# Patient Record
Sex: Male | Born: 1970 | Race: White | Hispanic: No | Marital: Married | Smoking: Never smoker
Health system: Southern US, Community
[De-identification: ages and names within clinical notes are randomized; demographics above are authoritative.]

## PROBLEM LIST (undated history)

## (undated) DIAGNOSIS — E063 Autoimmune thyroiditis: Secondary | ICD-10-CM

## (undated) DIAGNOSIS — F410 Panic disorder [episodic paroxysmal anxiety] without agoraphobia: Secondary | ICD-10-CM

## (undated) HISTORY — DX: Autoimmune thyroiditis: E06.3

## (undated) HISTORY — DX: Panic disorder (episodic paroxysmal anxiety): F41.0

---

## 2015-05-12 ENCOUNTER — Emergency Department (HOSPITAL_COMMUNITY)
Admission: EM | Admit: 2015-05-12 | Discharge: 2015-05-12 | Disposition: A | Discharge status: Home / Self Care | Payer: 59 | Attending: Emergency Medicine | Admitting: Emergency Medicine

## 2015-05-12 ENCOUNTER — Encounter (HOSPITAL_COMMUNITY): Payer: Self-pay | Admitting: *Deleted

## 2015-05-12 ENCOUNTER — Emergency Department (HOSPITAL_COMMUNITY): Payer: 59

## 2015-05-12 DIAGNOSIS — Y998 Other external cause status: Secondary | ICD-10-CM | POA: Diagnosis not present

## 2015-05-12 DIAGNOSIS — S0990XA Unspecified injury of head, initial encounter: Secondary | ICD-10-CM | POA: Diagnosis not present

## 2015-05-12 DIAGNOSIS — F419 Anxiety disorder, unspecified: Secondary | ICD-10-CM | POA: Insufficient documentation

## 2015-05-12 DIAGNOSIS — Z8639 Personal history of other endocrine, nutritional and metabolic disease: Secondary | ICD-10-CM | POA: Insufficient documentation

## 2015-05-12 DIAGNOSIS — R32 Unspecified urinary incontinence: Secondary | ICD-10-CM | POA: Insufficient documentation

## 2015-05-12 DIAGNOSIS — Y9389 Activity, other specified: Secondary | ICD-10-CM | POA: Diagnosis not present

## 2015-05-12 DIAGNOSIS — R569 Unspecified convulsions: Secondary | ICD-10-CM | POA: Insufficient documentation

## 2015-05-12 DIAGNOSIS — Y9289 Other specified places as the place of occurrence of the external cause: Secondary | ICD-10-CM | POA: Insufficient documentation

## 2015-05-12 DIAGNOSIS — X58XXXA Exposure to other specified factors, initial encounter: Secondary | ICD-10-CM | POA: Diagnosis not present

## 2015-05-12 LAB — COMPREHENSIVE METABOLIC PANEL
ALBUMIN: 4 g/dL (ref 3.5–5.0)
ALT: 32 U/L (ref 17–63)
AST: 31 U/L (ref 15–41)
Alkaline Phosphatase: 55 U/L (ref 38–126)
Anion gap: 7 (ref 5–15)
BUN: 14 mg/dL (ref 6–20)
CO2: 27 mmol/L (ref 22–32)
Calcium: 9.5 mg/dL (ref 8.9–10.3)
Chloride: 107 mmol/L (ref 101–111)
Creatinine, Ser: 1.28 mg/dL — ABNORMAL HIGH (ref 0.61–1.24)
GFR calc Af Amer: >60 mL/min (ref >60)
GFR calc non Af Amer: >60 mL/min (ref >60)
Glucose, Bld: 105 mg/dL — ABNORMAL HIGH (ref 65–99)
POTASSIUM: 4.7 mmol/L (ref 3.5–5.1)
Sodium: 141 mmol/L (ref 135–145)
TOTAL PROTEIN: 6.3 g/dL — AB (ref 6.5–8.1)
Total Bilirubin: 1.2 mg/dL (ref 0.3–1.2)

## 2015-05-12 LAB — CBC WITH DIFFERENTIAL/PLATELET
BASOS ABS: 0 10*3/uL (ref 0.0–0.1)
Basophils Relative: 0 % (ref 0–1)
Eosinophils Absolute: 0.1 10*3/uL (ref 0.0–0.7)
Eosinophils Relative: 2 % (ref 0–5)
HEMATOCRIT: 41.7 % (ref 39.0–52.0)
HEMOGLOBIN: 14 g/dL (ref 13.0–17.0)
LYMPHS ABS: 1.7 10*3/uL (ref 0.7–4.0)
Lymphocytes Relative: 27 % (ref 12–46)
MCH: 31.3 pg (ref 26.0–34.0)
MCHC: 33.6 g/dL (ref 30.0–36.0)
MCV: 93.3 fL (ref 78.0–100.0)
MONO ABS: 0.5 10*3/uL (ref 0.1–1.0)
MONOS PCT: 7 % (ref 3–12)
NEUTROS ABS: 3.9 10*3/uL (ref 1.7–7.7)
Neutrophils Relative %: 64 % (ref 43–77)
Platelets: 191 10*3/uL (ref 150–400)
RBC: 4.47 MIL/uL (ref 4.22–5.81)
RDW: 12.3 % (ref 11.5–15.5)
WBC: 6.1 10*3/uL (ref 4.0–10.5)

## 2015-05-12 MED ORDER — SODIUM CHLORIDE 0.9 % IV BOLUS (SEPSIS)
1000.0000 mL | Freq: Once | INTRAVENOUS | Status: AC
  Administered 2015-05-12: 1000 mL via INTRAVENOUS

## 2015-05-12 NOTE — ED Provider Notes (Signed)
CSN: [id number]     Arrival date & time 05/12/15  1319 History   First MD Initiated Contact with Patient 05/12/15 1326     Chief Complaint  Patient presents with  . Seizures     (Consider location/radiation/quality/duration/timing/severity/associated sxs/prior Treatment) Patient is a 44 y.o. male presenting with seizures. The history is provided by the patient. No language interpreter was used.  Seizures  [name] is a 44 year old male with a recent diagnosis of thyroid disease today while at his PCP. He states the physician was going over the results when he became anxious and scared. He states he felt nauseated and diaphoretic and does not remember what happened until he woke up and the physician and paramedics were standing in front of him. EMS states that he began staring and then had a tonic-clonic seizure for less than 3 minutes. He was postictal for several minutes. When they arrived they noticed that he was diaphoretic, nauseated and hypotensive. EMS also reports that the patient was incontinent of urine. He reports that this is the first time he has had a seizure. He states he was in a seated position and does not remember hitting his head or falling. Patient states he feels fine now and his symptoms have since resolved. He denies any recent illness, dizziness, headache, fever, chills, cough, shortness of breath, chest pain, abdominal pain, vomiting, diarrhea, constipation, dysuria, leg swelling.  History reviewed. No pertinent past medical history. History reviewed. No pertinent past surgical history. No family history on file. History  Substance Use Topics  . Smoking status: Never Smoker   . Smokeless tobacco: Not on file  . Alcohol Use: No    Review of Systems  Neurological: Positive for seizures.  All other systems reviewed and are negative.     Allergies  Review of patient's allergies indicates no known allergies.  Home Medications   Prior to Admission medications    Not on File   BP 111/64 mmHg  Pulse 37  Temp(Src) 97.3 F (36.3 C) (Oral)  Resp 13  Ht  (1.905 m)  Wt 190 lb (86.183 kg)  BMI 23.75 kg/m2  SpO2 100% Physical Exam  Constitutional: He is oriented to person, place, and time. He appears well-developed and well-nourished.  HENT:  Head: Normocephalic and atraumatic.  Eyes: Conjunctivae are normal.  Neck: Normal range of motion. Neck supple.  Cardiovascular: Normal rate, regular rhythm and normal heart sounds.   Pulmonary/Chest: Effort normal and breath sounds normal. No respiratory distress. He has no wheezes. He has no rales.  Abdominal: Soft. He exhibits no distension. There is no tenderness. There is no rebound and no guarding.  Musculoskeletal: Normal range of motion.  Neurological: He is alert and oriented to person, place, and time.  Skin: Skin is warm and dry.  Psychiatric: He has a normal mood and affect. His behavior is normal.  Nursing note and vitals reviewed.   ED Course  Procedures (including critical care time) Labs Review Labs Reviewed  COMPREHENSIVE METABOLIC PANEL - Abnormal; Notable for the following:    Glucose, Bld 105 (*)    Creatinine, Ser 1.28 (*)    Total Protein 6.3 (*)    All other components within normal limits  CBC WITH DIFFERENTIAL/PLATELET    Imaging Review Ct Head Wo Contrast  05/12/2015   CLINICAL DATA:  Seizures  EXAM: CT HEAD WITHOUT CONTRAST  TECHNIQUE: Contiguous axial images were obtained from the base of the skull through the vertex without intravenous contrast.  COMPARISON:  None.  FINDINGS: No skull fracture is noted. Paranasal sinuses and mastoid air cells are unremarkable.  No intracranial hemorrhage, mass effect or midline shift. No acute cortical infarction. No mass lesion is noted on this unenhanced scan.  There is a scalp nodule in right posterior parietal region axial image 30 measures 1.3 cm in length. This may represent a dermal nodule or sebaceous cyst. Clinical correlation  is necessary.  IMPRESSION: No acute intracranial abnormality. There is a scalp nodule in right parietal region posteriorly measures 1.3 cm. This may represent a sebaceous cyst or a dermal nodule.   Electronically Signed   By: Natasha Mead M.D.   On: 05/12/2015 16:18     EKG Interpretation   Date/Time:  Wednesday May 12 2015 13:30:55 EDT Ventricular Rate:  55 PR Interval:  140 QRS Duration: 109 QT Interval:  513 QTC Calculation: 491 R Axis:   85 Text Interpretation:  Sinus rhythm ST elev, probable normal early repol  pattern Borderline prolonged QT interval No old tracing to compare  Confirmed by [facility]  MD, ELLIOTT [phone]) on 05/12/2015 4:47:34 PM      MDM   Final diagnoses:  Seizure  Patient presents for a seizure that lasted less than 3 minutes while at his PCP today. EMS reports that he was postictal for several minutes after the seizure. It is unlikely that patient fell or hit his head.  Patient was in seated position and was staring before tonic clonic movements. They also state he was diaphoretic, nauseated and hypotensive. His symptoms have since resolved. Patient is no longer postictal and states he feels back to his baseline. This is his first seizure and he does not take any seizure medications. This is most likely due to vasovagal in after hearing the news of his new diagnosis. Since this is the first time that the patient has had a seizure with unknown head injury, I ordered a head CT. His labs including all of his electrolytes are not concerning. EKG shows bradycardia but no other abnormality. His vital signs have been stable since his arrival in the ED. He states he has a low heart rate at baseline. His head CT is negative for mass, skull fracture, hemorrhage, or infarct. He can follow up with neurology.  I explained that he should not drive until evaluated by neurology.  Patient agrees with the plan.     Catha Gosselin, PA-C 05/12/15 1841  Mancel Bale, MD 05/13/15  1544

## 2015-05-12 NOTE — ED Notes (Signed)
NAD at this time. Pt is stable and leaving with his wife.   

## 2015-05-12 NOTE — Discharge Instructions (Signed)
Seizure, Adult Do not drive until you follow up with neurology.  A seizure is abnormal electrical activity in the brain. Seizures usually last from 30 seconds to 2 minutes. There are various types of seizures. Before a seizure, you may have a warning sensation (aura) that a seizure is about to occur. An aura may include the following symptoms:   Fear or anxiety.  Nausea.  Feeling like the room is spinning (vertigo).  Vision changes, such as seeing flashing lights or spots. Common symptoms during a seizure include:  A change in attention or behavior (altered mental status).  Convulsions with rhythmic jerking movements.  Drooling.  Rapid eye movements.  Grunting.  Loss of bladder and bowel control.  Bitter taste in the mouth.  Tongue biting. After a seizure, you may feel confused and sleepy. You may also have an injury resulting from convulsions during the seizure. HOME CARE INSTRUCTIONS   If you are given medicines, take them exactly as prescribed by your health care provider.  Keep all follow-up appointments as directed by your health care provider.  Do not swim or drive or engage in risky activity during which a seizure could cause further injury to you or others until your health care provider says it is OK.  Get adequate rest.  Teach friends and family what to do if you have a seizure. They should:  Lay you on the ground to prevent a fall.  Put a cushion under your head.  Loosen any tight clothing around your neck.  Turn you on your side. If vomiting occurs, this helps keep your airway clear.  Stay with you until you recover.  Know whether or not you need emergency care. SEEK IMMEDIATE MEDICAL CARE IF:  The seizure lasts longer than 5 minutes.  The seizure is severe or you do not wake up immediately after the seizure.  You have an altered mental status after the seizure.  You are having more frequent or worsening seizures. Someone should drive you to  the emergency department or call local emergency services (911 in [place] MAKE SURE YOU:  Understand these instructions.  Will watch your condition.  Will get help right away if you are not doing well or get worse. Document Released: 11/10/2000 Document Revised: 09/03/2013 Document Reviewed: 06/25/2013 [facility] Patient Information 2015 [place], [place]. This information is not intended to replace advice given to you by your health care provider. Make sure you discuss any questions you have with your health care provider.

## 2015-05-12 NOTE — ED Notes (Signed)
Pt was at PCP to receive diagnosis of thyroid disease when he began to have a seizure.  Pt started off staring and then began to have tonic clonic movements. Seizure reported to last .  There was incontinence and diaphoresis.  Pt has no history of seizure.  VS: are as follows: BP:90/54 HR:47 CBG:114 Pt was given 4mg  of Zofran and 500cc of normal saline in route.

## 2015-06-14 ENCOUNTER — Ambulatory Visit: Payer: 59 | Admitting: Neurology

## 2015-07-16 ENCOUNTER — Ambulatory Visit (INDEPENDENT_AMBULATORY_CARE_PROVIDER_SITE_OTHER): Payer: 59 | Admitting: Neurology

## 2015-07-16 ENCOUNTER — Encounter: Payer: Self-pay | Admitting: Neurology

## 2015-07-16 VITALS — BP 90/64 | HR 55 | Resp 16 | Ht 75.0 in | Wt 190.0 lb

## 2015-07-16 DIAGNOSIS — F410 Panic disorder [episodic paroxysmal anxiety] without agoraphobia: Secondary | ICD-10-CM | POA: Insufficient documentation

## 2015-07-16 DIAGNOSIS — R569 Unspecified convulsions: Secondary | ICD-10-CM

## 2015-07-16 DIAGNOSIS — E063 Autoimmune thyroiditis: Secondary | ICD-10-CM | POA: Insufficient documentation
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
## 2015-07-16 NOTE — Patient Instructions (Signed)
1. Schedule routine EEG 2. As per [place] driving laws, no driving after a seizure, until 6 months seizure-free 3. Follow-up in 4 months, call for any changes

## 2015-07-22 ENCOUNTER — Ambulatory Visit (INDEPENDENT_AMBULATORY_CARE_PROVIDER_SITE_OTHER): Payer: 59 | Admitting: Neurology

## 2015-07-22 DIAGNOSIS — R569 Unspecified convulsions: Secondary | ICD-10-CM

## 2015-07-27 ENCOUNTER — Telehealth: Payer: Self-pay | Admitting: Family Medicine

## 2015-07-27 NOTE — Telephone Encounter (Signed)
Patient was notified of result. 

## 2015-07-27 NOTE — Telephone Encounter (Signed)
-----   Message from Van Clines, MD sent at 07/27/2015  9:59 AM EDT ----- Pls let him know EEG is normal, thanks

## 2015-07-27 NOTE — Procedures (Signed)
ELECTROENCEPHALOGRAM REPORT  Date of Study: 07/22/2015  Patient's Name: Jonathon Brandt MRN: [id number] Date of Birth: [birth year]  Referring Provider: Dr. Patrcia Dolly  Clinical History: This is a 44 year old man who had a witnessed seizure at his doctor's office after he started panicking/hyperventilating.  Medications: No anti-epileptic medications  Technical Summary: A multichannel digital EEG recording measured by the international 10-20 system with electrodes applied with paste and impedances below 5000 ohms performed in our laboratory with EKG monitoring in an awake and asleep patient.  Hyperventilation and photic stimulation were performed.  The digital EEG was referentially recorded, reformatted, and digitally filtered in a variety of bipolar and referential montages for optimal display.    Description: The patient is awake and asleep during the recording.  During maximal wakefulness, there is a symmetric, medium voltage 9.5 Hz posterior dominant rhythm that attenuates with eye opening.  The record is symmetric.  During drowsiness and sleep, there is an increase in theta slowing of the background.  Vertex waves and symmetric sleep spindles were seen.  Hyperventilation and photic stimulation did not elicit any abnormalities.  There were no epileptiform discharges or electrographic seizures seen.    EKG lead was unremarkable.  Impression: This awake and asleep EEG is normal.    Clinical Correlation: A normal EEG does not exclude a clinical diagnosis of epilepsy.  If further clinical questions remain, prolonged EEG may be helpful.  Clinical correlation is advised.   Patrcia Dolly, M.D.

## 2015-11-15 ENCOUNTER — Ambulatory Visit: Payer: 59 | Admitting: Neurology

## 2016-03-07 ENCOUNTER — Ambulatory Visit: Payer: 59 | Admitting: Neurology

## 2016-03-29 ENCOUNTER — Ambulatory Visit: Payer: 59 | Admitting: Neurology

## 2016-04-29 IMAGING — CT CT HEAD W/O CM
2 series · 16 of 30 positions shown, 20 images · non-contrast
Comparison: None.

CLINICAL DATA: Seizures

EXAM:
CT HEAD WITHOUT CONTRAST
TECHNIQUE: Contiguous axial images were obtained from the base of the skull
through the vertex without intravenous contrast.

[Series 201: head w/o, idose (1) · axial · non-contrast · 0.49mm/px · z∈[+54,+199]mm · 13 of 35 slices shown, 17 images]
[im 3/35  brain]
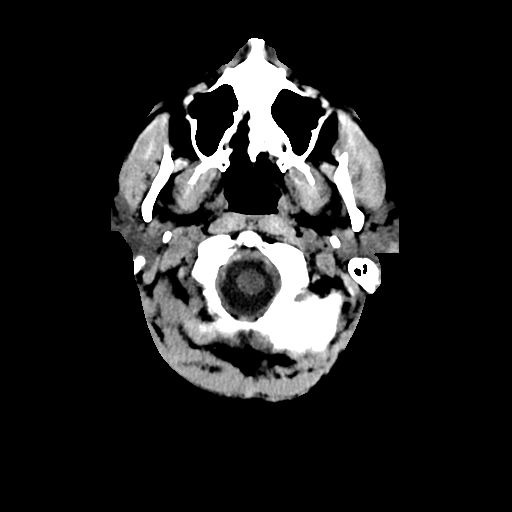
[im 3/35  bone]
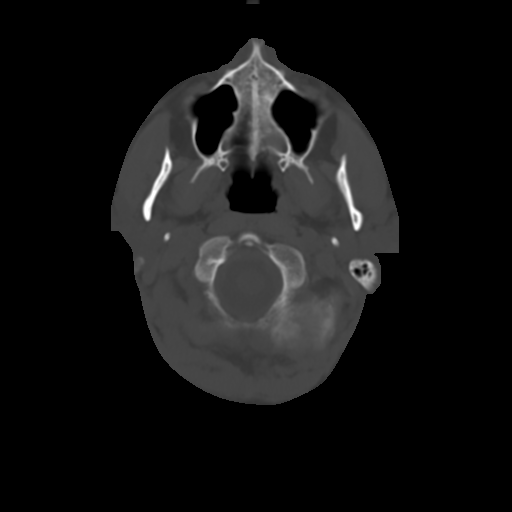
[im 5/35  brain]
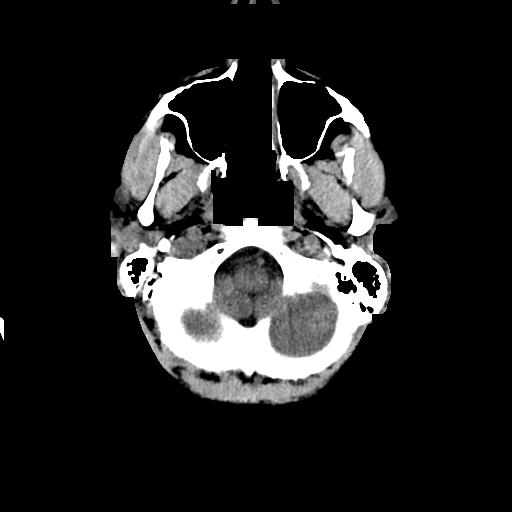
[im 8/35  brain]
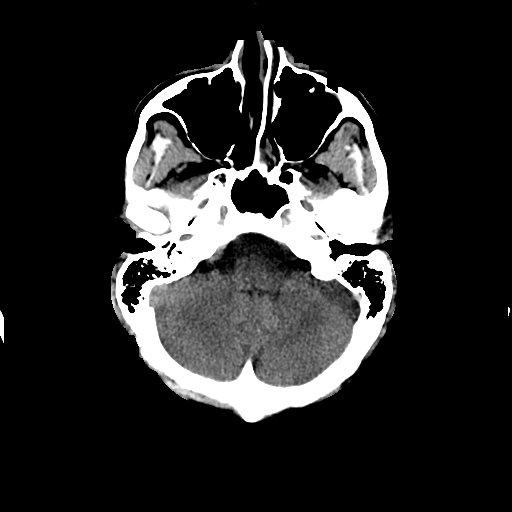
[im 10/35  brain]
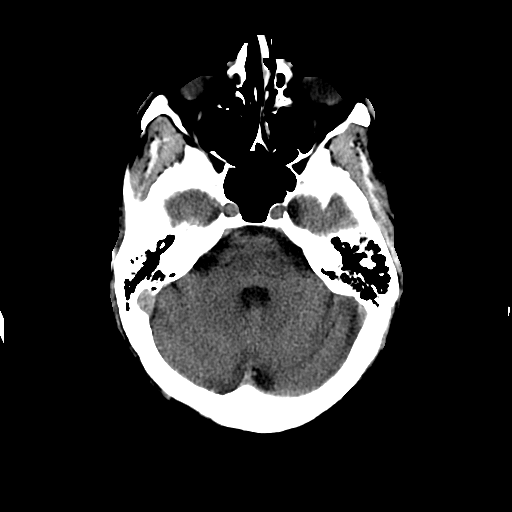
[im 13/35  brain]
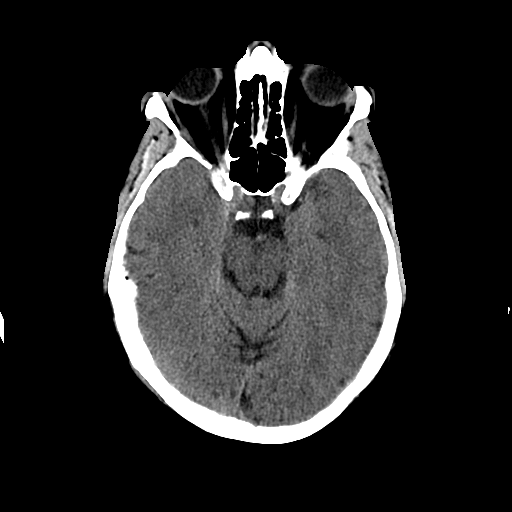
[im 13/35  bone]
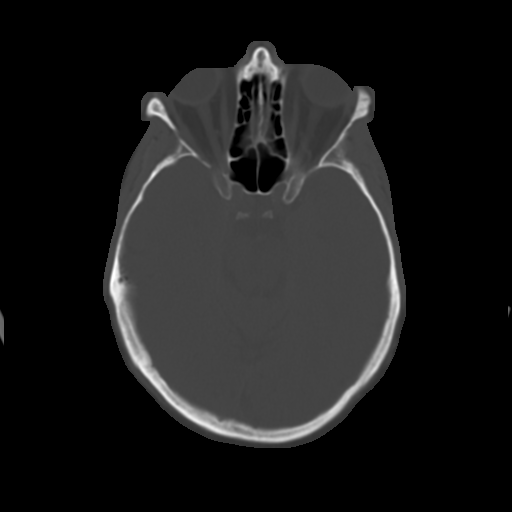
[im 15/35  brain]
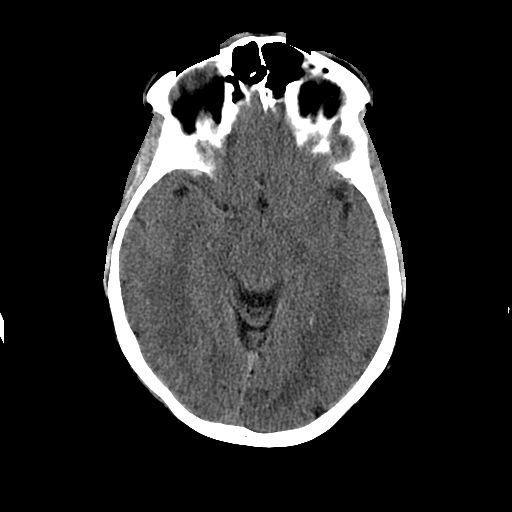
[im 18/35  brain]
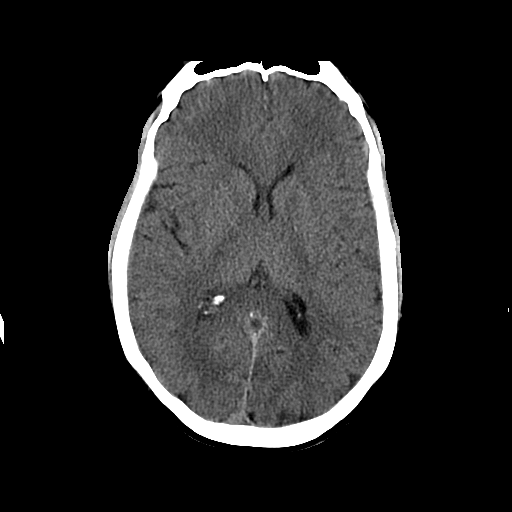
[im 20/35  brain]
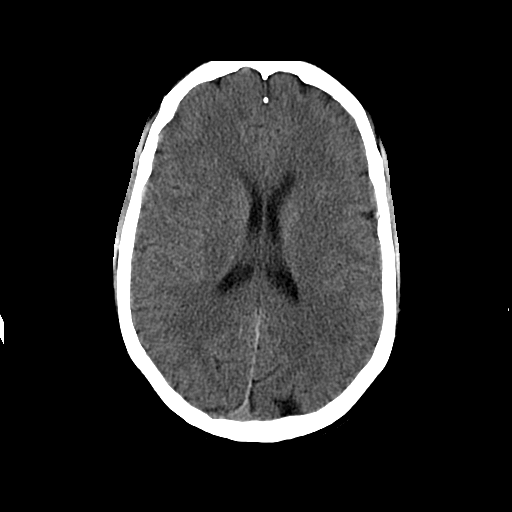
[im 22/35  brain]
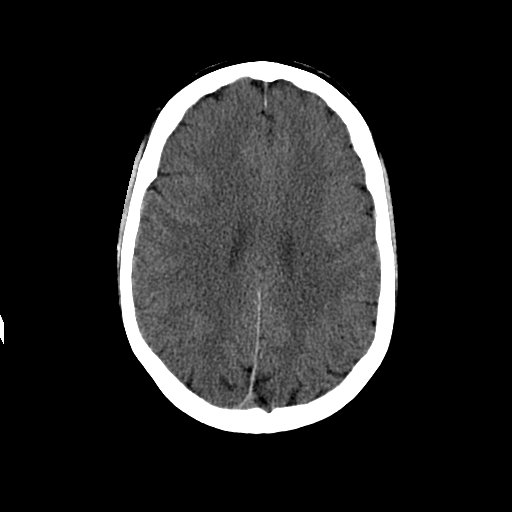
[im 22/35  bone]
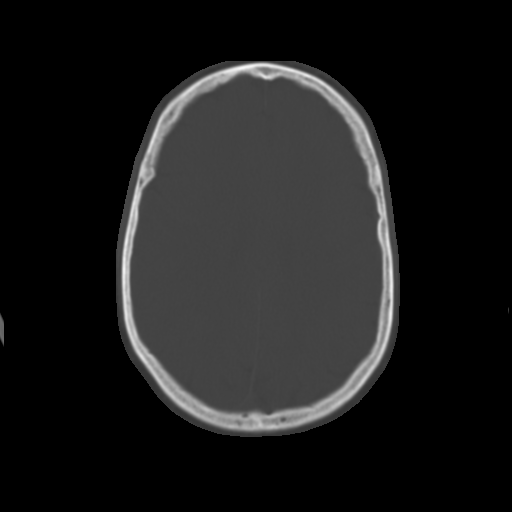
[im 25/35  brain]
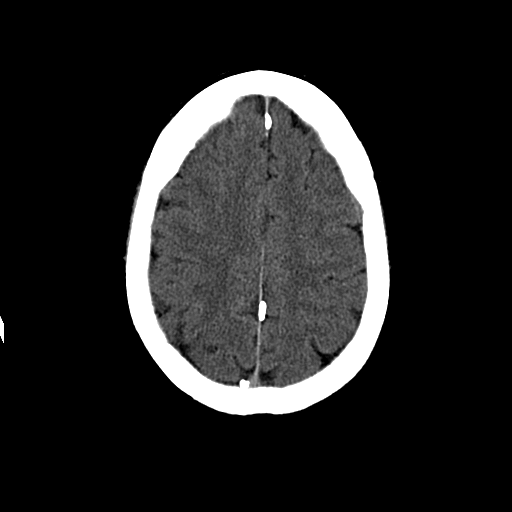
[im 27/35  brain]
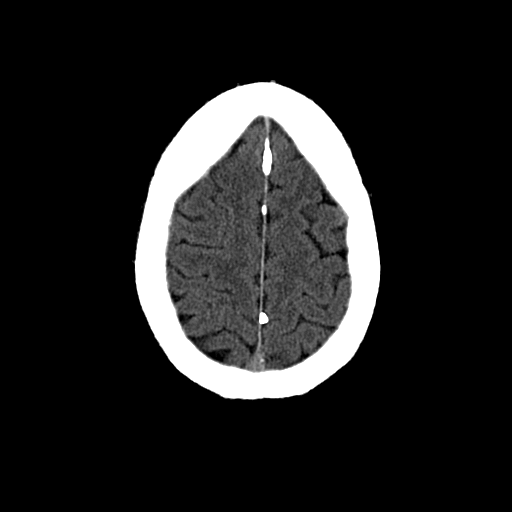
[im 30/35  brain]
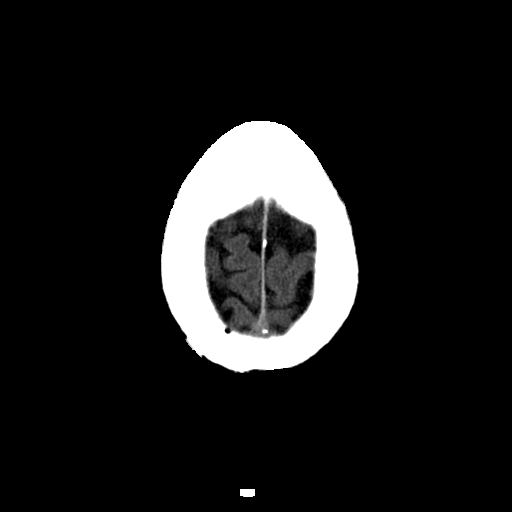
[im 32/35  brain]
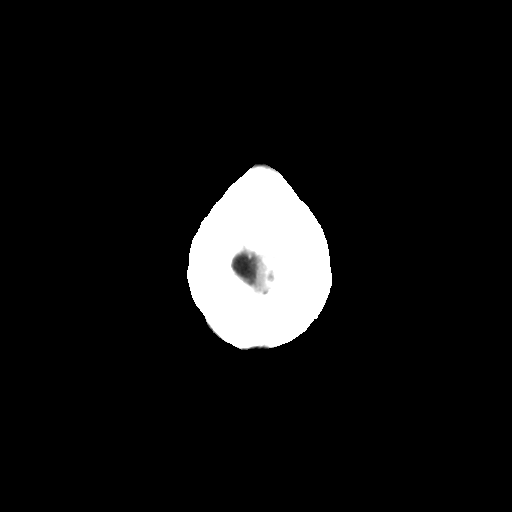
[im 32/35  bone]
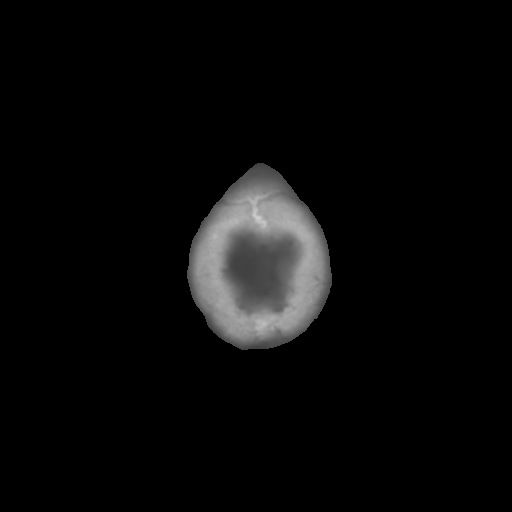

[Series 202: head w/o bone, idose (1) · axial · non-contrast · 0.49mm/px · z∈[+54,+104]mm · 3 of 35 slices shown]
[im 3/35  bone]
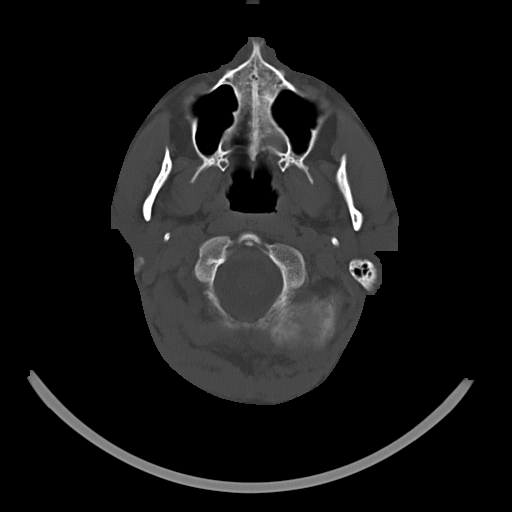
[im 8/35  bone]
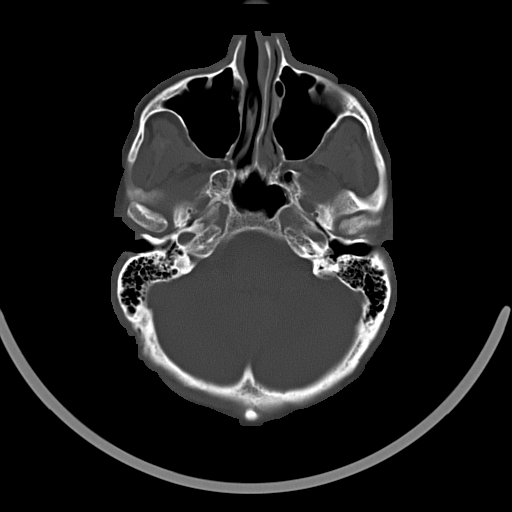
[im 13/35  bone]
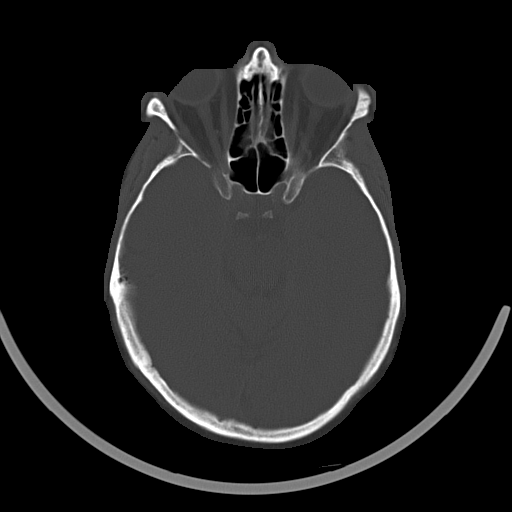

[16 of 30 positions shown; findings below may reference images not displayed]

FINDINGS: No skull fracture is noted. Paranasal sinuses and mastoid air cells
are unremarkable.

No intracranial hemorrhage, mass effect or midline shift. No acute
cortical infarction. No mass lesion is noted on this unenhanced
scan.

There is a scalp nodule in right posterior parietal region axial
image 30 measures 1.3 cm in length. This may represent a dermal
nodule or sebaceous cyst. Clinical correlation is necessary.
IMPRESSION: No acute intracranial abnormality. There is a scalp nodule in right
parietal region posteriorly measures 1.3 cm. This may represent a
sebaceous cyst or a dermal nodule.

## 2020-05-04 ENCOUNTER — Emergency Department (HOSPITAL_COMMUNITY): Payer: Managed Care, Other (non HMO)

## 2020-05-04 ENCOUNTER — Other Ambulatory Visit: Payer: Self-pay

## 2020-05-04 ENCOUNTER — Encounter (HOSPITAL_COMMUNITY): Payer: Self-pay | Admitting: Emergency Medicine

## 2020-05-04 DIAGNOSIS — Y9355 Activity, bike riding: Secondary | ICD-10-CM | POA: Diagnosis not present

## 2020-05-04 DIAGNOSIS — Y999 Unspecified external cause status: Secondary | ICD-10-CM | POA: Insufficient documentation

## 2020-05-04 DIAGNOSIS — Y92410 Unspecified street and highway as the place of occurrence of the external cause: Secondary | ICD-10-CM | POA: Diagnosis not present

## 2020-05-04 DIAGNOSIS — S42021A Displaced fracture of shaft of right clavicle, initial encounter for closed fracture: Secondary | ICD-10-CM | POA: Diagnosis not present

## 2020-05-04 DIAGNOSIS — S40911A Unspecified superficial injury of right shoulder, initial encounter: Secondary | ICD-10-CM | POA: Diagnosis present
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
## 2020-05-05 ENCOUNTER — Emergency Department (HOSPITAL_COMMUNITY)
Admission: EM | Admit: 2020-05-05 | Discharge: 2020-05-05 | Disposition: A | Discharge status: Home / Self Care | Payer: Managed Care, Other (non HMO) | Attending: Emergency Medicine | Admitting: Emergency Medicine

## 2020-05-05 DIAGNOSIS — S42031A Displaced fracture of lateral end of right clavicle, initial encounter for closed fracture: Secondary | ICD-10-CM

## 2020-05-05 MED ORDER — IBUPROFEN 200 MG PO TABS
600.0000 mg | ORAL_TABLET | Freq: Once | ORAL | Status: AC
  Administered 2020-05-05: 600 mg via ORAL
  Filled 2020-05-05: qty 3

## 2020-05-05 MED ORDER — IBUPROFEN 600 MG PO TABS: 600.0000 mg | ORAL_TABLET | Freq: Four times a day (QID) | ORAL | 0 refills | Status: AC | PRN
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
## 2020-05-05 NOTE — Discharge Instructions (Signed)
Wear the sling when active. Take ibuprofen as prescribed. Cool compresses to reduce any swelling.   Follow up with Dr. Susa Simmonds for further management of clavicle fracture.
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
## 2023-03-02 ENCOUNTER — Other Ambulatory Visit: Payer: Self-pay | Admitting: Internal Medicine

## 2023-03-02 DIAGNOSIS — N5089 Other specified disorders of the male genital organs: Secondary | ICD-10-CM

## 2023-03-07 ENCOUNTER — Ambulatory Visit
Admission: RE | Admit: 2023-03-07 | Discharge: 2023-03-07 | Disposition: A | Discharge status: Home / Self Care | Payer: Managed Care, Other (non HMO) | Source: Ambulatory Visit | Attending: Internal Medicine | Admitting: Internal Medicine

## 2023-03-07 DIAGNOSIS — N5089 Other specified disorders of the male genital organs: Secondary | ICD-10-CM
